# Patient Record
Sex: Male | Born: 2000 | Race: White | Hispanic: No | Marital: Single | Smoking: Never smoker
Health system: Southern US, Community
[De-identification: ages and names within clinical notes are randomized; demographics above are authoritative.]

## PROBLEM LIST (undated history)

## (undated) DIAGNOSIS — J45909 Unspecified asthma, uncomplicated: Secondary | ICD-10-CM

## (undated) HISTORY — PX: ADENOIDECTOMY: SUR15

## (undated) HISTORY — PX: TYMPANOSTOMY TUBE PLACEMENT: SHX32

## (undated) HISTORY — PX: TONSILLECTOMY: SUR1361

---
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
## 2022-05-19 ENCOUNTER — Ambulatory Visit (INDEPENDENT_AMBULATORY_CARE_PROVIDER_SITE_OTHER): Payer: 59 | Admitting: Family Medicine

## 2022-05-19 ENCOUNTER — Encounter: Payer: Self-pay | Admitting: Family Medicine

## 2022-05-19 VITALS — BP 122/80 | HR 75 | Temp 97.8°F | Resp 18 | Ht 72.0 in | Wt 161.0 lb

## 2022-05-19 DIAGNOSIS — Z114 Encounter for screening for human immunodeficiency virus [HIV]: Secondary | ICD-10-CM | POA: Diagnosis not present

## 2022-05-19 DIAGNOSIS — Z1159 Encounter for screening for other viral diseases: Secondary | ICD-10-CM

## 2022-05-19 DIAGNOSIS — Z0000 Encounter for general adult medical examination without abnormal findings: Secondary | ICD-10-CM

## 2022-05-19 LAB — CBC
HCT: 44.5 % (ref 36.0–49.0)
Hemoglobin: 15 g/dL (ref 12.0–16.0)
MCHC: 33.7 g/dL (ref 31.0–37.0)
MCV: 83.9 fl (ref 78.0–98.0)
Platelets: 281 10*3/uL (ref 150.0–575.0)
RBC: 5.3 Mil/uL (ref 3.80–5.70)
RDW: 13.5 % (ref 11.4–15.5)
WBC: 10.6 10*3/uL (ref 4.5–13.5)

## 2022-05-19 LAB — COMPREHENSIVE METABOLIC PANEL
ALT: 26 U/L (ref 0–53)
AST: 17 U/L (ref 0–37)
Albumin: 4.8 g/dL (ref 3.5–5.2)
Alkaline Phosphatase: 87 U/L (ref 52–171)
BUN: 16 mg/dL (ref 6–23)
CO2: 29 mEq/L (ref 19–32)
Calcium: 10.1 mg/dL (ref 8.4–10.5)
Chloride: 101 mEq/L (ref 96–112)
Creatinine, Ser: 0.74 mg/dL (ref 0.40–1.50)
GFR: 131.73 mL/min (ref >60.00)
Glucose, Bld: 82 mg/dL (ref 70–99)
Potassium: 4.5 mEq/L (ref 3.5–5.1)
Sodium: 139 mEq/L (ref 135–145)
Total Bilirubin: 0.5 mg/dL (ref 0.2–1.2)
Total Protein: 7.6 g/dL (ref 6.0–8.3)

## 2022-05-19 LAB — LIPID PANEL
Cholesterol: 194 mg/dL (ref 0–200)
HDL: 71.5 mg/dL (ref >39.00)
LDL Cholesterol: 111 mg/dL — ABNORMAL HIGH (ref 0–99)
NonHDL: 122.95
Total CHOL/HDL Ratio: 3
Triglycerides: 59 mg/dL (ref 0.0–149.0)
VLDL: 11.8 mg/dL (ref 0.0–40.0)

## 2022-05-19 NOTE — Progress Notes (Signed)
Chief Complaint  Patient presents with   Annual Exam    Concerns/ questions: none    Well Male Johnny Hampton is here for a complete physical.   His last physical was >1 year ago.  Current diet: in general, a "healthy" diet.   Current exercise: baseball workouts, lifting wts Weight trend: stable Fatigue out of ordinary? No. Seat belt? Yes.    Health maintenance Tetanus- Yes HIV- No Hep C- No  Past Medical History:  Diagnosis Date   Asthma    starting to grow out of it     Past Surgical History:  Procedure Laterality Date   ADENOIDECTOMY     TONSILLECTOMY     TYMPANOSTOMY TUBE PLACEMENT      Medications  Current Outpatient Medications on File Prior to Visit  Medication Sig Dispense Refill   ondansetron (ZOFRAN ODT) 4 MG disintegrating tablet Take 1 tablet (4 mg total) by mouth every 8 (eight) hours as needed for nausea or vomiting. 30 tablet 3   albuterol (PROVENTIL HFA;VENTOLIN HFA) 108 (90 BASE) MCG/ACT inhaler Inhale 2 puffs into the lungs every 6 (six) hours as needed for wheezing or shortness of breath. 1 Inhaler 0    Allergies Allergies  Allergen Reactions   Cephalosporins Rash   Omnicef [Cefdinir] Rash   Tape Rash    Family History Family History  Problem Relation Age of Onset   Cancer Mother        Thyroid   Hearing loss Father    Cancer Maternal Grandfather    COPD Paternal Grandfather    Diabetes Paternal Grandfather    Heart disease Paternal Grandfather    Kidney cancer Paternal Grandfather     Review of Systems: Constitutional: no fevers or chills Eye:  no recent significant change in vision Ear/Nose/Mouth/Throat:  Ears:  no hearing loss Nose/Mouth/Throat:  no complaints of nasal congestion, no sore throat Cardiovascular:  no chest pain Respiratory:  no shortness of breath Gastrointestinal:  no abdominal pain, no change in bowel habits GU:  Male: negative for dysuria Musculoskeletal/Extremities:  no pain of the joints Integumentary  (Skin/Breast):  no abnormal skin lesions reported Neurologic:  no headaches Endocrine: No unexpected weight changes Hematologic/Lymphatic:  no night sweats  Exam BP 122/80 (BP Location: Left Arm, Patient Position: Sitting, Cuff Size: Normal)   Pulse 75   Temp 97.8 F (36.6 C) (Temporal)   Resp 18   Ht 6' (1.829 m)   Wt 161 lb (73 kg)   SpO2 98%   BMI 21.84 kg/m  General:  well developed, well nourished, in no apparent distress Skin:  no significant moles, warts, or growths Head:  no masses, lesions, or tenderness Eyes:  pupils equal and round, sclera anicteric without injection Ears:  canals without lesions, TMs shiny without retraction, no obvious effusion, no erythema Nose:  nares patent, mucosa normal Throat/Pharynx:  lips and gingiva without lesion; tongue and uvula midline; non-inflamed pharynx; no exudates or postnasal drainage Neck: neck supple without adenopathy, thyromegaly, or masses Lungs:  clear to auscultation, breath sounds equal bilaterally, no respiratory distress Cardio:  regular rate and rhythm, no bruits, no LE edema Abdomen:  abdomen soft, nontender; bowel sounds normal; no masses or organomegaly Genital (male): Deferred Rectal: Deferred Musculoskeletal:  symmetrical muscle groups noted without atrophy or deformity Extremities:  no clubbing, cyanosis, or edema, no deformities, no skin discoloration Neuro:  gait normal; deep tendon reflexes normal and symmetric Psych: well oriented with normal range of affect and appropriate judgment/insight  Assessment  and Plan  Well adult exam - Plan: CBC, Comprehensive metabolic panel, Lipid panel  Screening for HIV without presence of risk factors - Plan: HIV Antibody (routine testing w rflx)  Encounter for hepatitis C screening test for low risk patient - Plan: Hepatitis C antibody   Well 21 y.o. male. Counseled on diet and exercise. Self testicular exams recommended at least monthly.  Briefly discussed HPV  vaccination which he politely declined.  Other orders as above. Follow up in 1 year pending the above workup. The patient voiced understanding and agreement to the plan.  Jilda Roche [place], DO 05/19/22 1:04 PM

## 2022-05-19 NOTE — Patient Instructions (Signed)
Give us 2-3 business days to get the results of your labs back.   Keep the diet clean and stay active.  Do monthly self testicular checks in the shower. You are feeling for lumps/bumps that don't belong. If you feel anything like this, let me know!  Let us know if you need anything. 

## 2022-05-20 LAB — HEPATITIS C ANTIBODY: Hepatitis C Ab: NONREACTIVE

## 2022-05-20 LAB — HIV ANTIBODY (ROUTINE TESTING W REFLEX): HIV 1&2 Ab, 4th Generation: NONREACTIVE

## 2022-05-26 ENCOUNTER — Encounter: Payer: Self-pay | Admitting: Family Medicine

## 2022-05-26 ENCOUNTER — Ambulatory Visit: Payer: 59 | Admitting: Family Medicine

## 2022-05-26 VITALS — BP 139/71 | HR 87 | Temp 99.5°F | Resp 16 | Wt 163.0 lb

## 2022-05-26 DIAGNOSIS — J101 Influenza due to other identified influenza virus with other respiratory manifestations: Secondary | ICD-10-CM | POA: Diagnosis not present

## 2022-05-26 LAB — POCT INFLUENZA A/B
Influenza A, POC: NEGATIVE
Influenza B, POC: POSITIVE — AB

## 2022-05-26 MED ORDER — OSELTAMIVIR PHOSPHATE 75 MG PO CAPS: 75.0000 mg | ORAL_CAPSULE | Freq: Two times a day (BID) | ORAL | 0 refills | Status: AC

## 2022-05-26 NOTE — Patient Instructions (Addendum)
OK to take Tylenol 1000 mg (2 extra strength tabs) or 975 mg (3 regular strength tabs) every 6 hours as needed.  Ibuprofen 400-600 mg (2-3 over the counter strength tabs) every 6 hours as needed for pain.  Continue to push fluids, practice good hand hygiene, and cover your mouth if you cough.  If you start having fevers, shaking or shortness of breath, seek immediate care.  Stay hydrated.   Have grandma send me a MyChart message.  Let us know if you need anything.

## 2022-05-26 NOTE — Progress Notes (Signed)
Chief Complaint  Patient presents with   Cough    Complains of coughing since yesterday, had negative covid test at home yesterday   Fever    Complains of fever of up to 103 on and off since yesterday    Garrie Woodin here for URI complaints. Here w mom.   Duration: 1 day  Associated symptoms: Fever (103 F) and cough Denies: sinus congestion, sinus pain, rhinorrhea, itchy watery eyes, ear pain, ear drainage, sore throat, wheezing, shortness of breath, and myalgia Treatment to date: Tylenol, ibuprofen Sick contacts: No  Past Medical History:  Diagnosis Date   Asthma    starting to grow out of it    Objective BP 139/71 (BP Location: Left Arm, Patient Position: Sitting, Cuff Size: Small)   Pulse 87   Temp 99.5 F (37.5 C) (Oral)   Resp 16   Wt 163 lb (73.9 kg)   SpO2 100%   BMI 22.11 kg/m  General: Awake, alert, appears stated age HEENT: AT, [place], ears patent b/l and TM's neg, nares patent w/o discharge, pharynx pink and without exudates, MMM Neck: No masses or asymmetry Heart: RRR Lungs: CTAB, no accessory muscle use Psych: Age appropriate judgment and insight, normal mood and affect  Influenza B - Plan: POCT Influenza A/B, oseltamivir (TAMIFLU) 75 MG capsule  Discussed starting Tamiflu. Grandma going thru breast cancer, she is our patient so will have her reach out to [place] for prophyContinue to push fluids, practice good hand hygiene, cover mouth when coughing. F/u prn. If starting to experience worsening symptoms, shaking, or shortness of breath, seek immediate care. Pt and his mom  voiced understanding and agreement to the plan.  Jilda Roche [place], DO 05/26/22 11:50 AM

## 2022-06-08 ENCOUNTER — Telehealth (INDEPENDENT_AMBULATORY_CARE_PROVIDER_SITE_OTHER): Payer: 59 | Admitting: Family Medicine

## 2022-06-08 ENCOUNTER — Encounter: Payer: Self-pay | Admitting: Family Medicine

## 2022-06-08 DIAGNOSIS — H1032 Unspecified acute conjunctivitis, left eye: Secondary | ICD-10-CM | POA: Diagnosis not present

## 2022-06-08 MED ORDER — POLYMYXIN B-TRIMETHOPRIM 10000-0.1 UNIT/ML-% OP SOLN: 1.0000 [drp] | OPHTHALMIC | 0 refills | Status: DC

## 2022-06-08 NOTE — Progress Notes (Signed)
No chief complaint on file.   Johnny Hampton is here for left eye irritation. Due to COVID-19 pandemic, we are interacting via web portal for an electronic face-to-face visit. I verified patient's ID using 2 identifiers. Patient agreed to proceed with visit via this method. Patient is at home, I am at office. Patient and I are present for visit.   Duration: 1  d Chemical exposure? No  Recent URI? No  Contact lenses? No  History of allergies? No  Treatment to date: Clear Eyes drops  Past Medical History:  Diagnosis Date   Asthma    starting to grow out of it   Family History  Problem Relation Age of Onset   Cancer Mother        Thyroid   Hearing loss Father    Cancer Maternal Grandfather    COPD Paternal Grandfather    Diabetes Paternal Grandfather    Heart disease Paternal Grandfather    Kidney cancer Paternal Grandfather    No conversational dyspnea Age appropriate judgment and insight Nml affect and mood  Acute conjunctivitis of left eye, unspecified acute conjunctivitis type - Plan: trimethoprim-polymyxin b (POLYTRIM) ophthalmic solution  Instructed to practice good hand hygiene and try not to touch face. Warm compresses and artificial tears also recommended. F/u if no improvement in 7-10 days. Pt voiced understanding and agreement to the plan.  [place], DO 06/08/22 4:24 PM

## 2023-01-12 ENCOUNTER — Ambulatory Visit: Payer: 59 | Admitting: Family Medicine

## 2023-05-24 ENCOUNTER — Ambulatory Visit (INDEPENDENT_AMBULATORY_CARE_PROVIDER_SITE_OTHER): Payer: 59 | Admitting: Family Medicine

## 2023-05-24 ENCOUNTER — Encounter: Payer: Self-pay | Admitting: Family Medicine

## 2023-05-24 VITALS — BP 140/90 | HR 97 | Temp 97.9°F | Resp 16 | Ht 72.0 in | Wt 154.0 lb

## 2023-05-24 DIAGNOSIS — Z0000 Encounter for general adult medical examination without abnormal findings: Secondary | ICD-10-CM

## 2023-05-24 DIAGNOSIS — F321 Major depressive disorder, single episode, moderate: Secondary | ICD-10-CM

## 2023-05-24 DIAGNOSIS — Z0001 Encounter for general adult medical examination with abnormal findings: Secondary | ICD-10-CM | POA: Diagnosis not present

## 2023-05-24 LAB — LIPID PANEL
Cholesterol: 158 mg/dL (ref 0–200)
HDL: 54.5 mg/dL (ref >39.00)
LDL Cholesterol: 93 mg/dL (ref 0–99)
NonHDL: 103.71
Total CHOL/HDL Ratio: 3
Triglycerides: 53 mg/dL (ref 0.0–149.0)
VLDL: 10.6 mg/dL (ref 0.0–40.0)

## 2023-05-24 LAB — CBC
HCT: 44.5 % (ref 39.0–52.0)
Hemoglobin: 15 g/dL (ref 13.0–17.0)
MCHC: 33.6 g/dL (ref 30.0–36.0)
MCV: 84.4 fL (ref 78.0–100.0)
Platelets: 338 10*3/uL (ref 150.0–400.0)
RBC: 5.27 Mil/uL (ref 4.22–5.81)
RDW: 13.4 % (ref 11.5–14.6)
WBC: 9.3 10*3/uL (ref 4.5–10.5)

## 2023-05-24 LAB — COMPREHENSIVE METABOLIC PANEL
ALT: 21 U/L (ref 0–53)
AST: 22 U/L (ref 0–37)
Albumin: 4.9 g/dL (ref 3.5–5.2)
Alkaline Phosphatase: 96 U/L (ref 39–117)
BUN: 12 mg/dL (ref 6–23)
CO2: 28 meq/L (ref 19–32)
Calcium: 10.1 mg/dL (ref 8.4–10.5)
Chloride: 103 meq/L (ref 96–112)
Creatinine, Ser: 0.83 mg/dL (ref 0.40–1.50)
GFR: 126.34 mL/min (ref >60.00)
Glucose, Bld: 95 mg/dL (ref 70–99)
Potassium: 4.1 meq/L (ref 3.5–5.1)
Sodium: 143 meq/L (ref 135–145)
Total Bilirubin: 0.9 mg/dL (ref 0.2–1.2)
Total Protein: 7.7 g/dL (ref 6.0–8.3)

## 2023-05-24 MED ORDER — SERTRALINE HCL 25 MG PO TABS: 25.0000 mg | ORAL_TABLET | Freq: Every day | ORAL | 3 refills | Status: DC

## 2023-05-24 NOTE — Progress Notes (Signed)
Chief Complaint  Patient presents with   Annual Exam   Depression    Well Male Johnny Hampton is here for a complete physical.   His last physical was >1 year ago.  Current diet: in general, a "healthy" diet.   Current exercise: strength training, cardio, baseball drills Weight trend: stable Fatigue out of ordinary? No. Seat belt? Mostly.  Health maintenance Tetanus- Due HIV- Yes Hep C- Yes  A little over a year ago, the patient started having loss of interest in doing things.  He denies any anxiety.  + Family history of anxiety depression.  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  No thoughts of harming himself or others, no self-medication.  He is following with a therapist but does not tell her everything he is going through.  Past Medical History:  Diagnosis Date   Asthma    starting to grow out of it     Past Surgical History:  Procedure Laterality Date   ADENOIDECTOMY     TONSILLECTOMY     TYMPANOSTOMY TUBE PLACEMENT      Medications  Takes no meds routinely.   Allergies Allergies  Allergen Reactions   Cephalosporins Rash   Omnicef [Cefdinir] Rash   Tape Rash    Family History Family History  Problem Relation Age of Onset   Cancer Mother        Thyroid   Hearing loss Father    Cancer Maternal Grandfather    COPD Paternal Grandfather    Diabetes Paternal Grandfather    Heart disease Paternal Grandfather    Kidney cancer Paternal Grandfather     Review of Systems: Constitutional: no fevers or chills Eye:  no recent significant change in vision Ear/Nose/Mouth/Throat:  Ears:  no hearing loss Nose/Mouth/Throat:  no complaints of nasal congestion, no sore throat Cardiovascular:  no chest pain Respiratory:  no shortness of breath Gastrointestinal:  no abdominal pain, no change in bowel habits GU:  Male: negative for  dysuria Musculoskeletal/Extremities:  no pain of the joints Integumentary (Skin/Breast):  no abnormal skin lesions reported Neurologic:  no headaches Endocrine: No unexpected weight changes Hematologic/Lymphatic:  no night sweats  Exam BP (!) 140/90 (BP Location: Left Arm, Patient Position: Sitting, Cuff Size: Small)   Pulse 97   Temp 97.9 F (36.6 C) (Oral)   Resp 16   Ht 6' (1.829 m)   Wt 154 lb (69.9 kg)   SpO2 97%   BMI 20.89 kg/m  General:  well developed, well nourished, in no apparent distress Skin:  no significant moles, warts, or growths Head:  no masses, lesions, or tenderness Eyes:  pupils equal and round, sclera anicteric without injection Ears:  canals without lesions, TMs shiny without retraction, no obvious effusion, no erythema Nose:  nares patent, mucosa normal Throat/Pharynx:  lips and gingiva without lesion; tongue and uvula midline; non-inflamed pharynx; no exudates or postnasal drainage Neck: neck supple without adenopathy, thyromegaly, or masses Lungs:  clear to auscultation, breath sounds equal bilaterally, no respiratory distress Cardio:  regular rate and rhythm, no bruits, no LE edema Abdomen:  abdomen soft, nontender; bowel sounds normal; no masses or organomegaly Genital (male): Deferred Rectal: Deferred Musculoskeletal:  symmetrical muscle groups noted without atrophy or deformity Extremities:  no clubbing, cyanosis, or edema, no deformities, no skin discoloration Neuro:  gait normal; deep tendon reflexes normal and symmetric Psych: well oriented with normal range of affect and appropriate  judgment/insight  Assessment and Plan  Well adult exam - Plan: CBC, Comprehensive metabolic panel, Lipid panel  Depression, major, single episode, moderate (HCC) - Plan: sertraline (ZOLOFT) 25 MG tablet   Well 22 y.o. male. Counseled on diet and exercise. Self testicular exams recommended at least monthly.  Depression: Chronic, not controlled.  Recommended he  find a new counselor who he is more comfortable communicating with.  A list of resources were provided in his AVS.  Start Zoloft 25 mg daily.  I will plan to see him in around 6 weeks to follow-up on this. We forgot to update his tetanus shot before he left. Other orders as above. The patient voiced understanding and agreement to the plan.  Jilda Roche [place], DO 05/24/23 11:41 AM

## 2023-05-24 NOTE — Patient Instructions (Addendum)
Give [place] 2-3 business days to get the results of your labs back.   Keep the diet clean and stay active.  Do monthly self testicular checks in the shower. You are feeling for lumps/bumps that don't belong. If you feel anything like this, let me know!  Please consider counseling. Contact [phone] to schedule an appointment or inquire about cost/insurance coverage.  [facility] located at [address], [place], [place].  Phone number = [phone].  Dr. Regan Lemming - Adult Psychiatry.    [facility] located at [address] [place], [place], [place]. Phone number = [phone].   [facility] located at [address] [place], [place].  Phone number = [phone].   [facility] located at [address] [place], [place], [place].  Phone number = [phone].  Let us know if you need anything.

## 2023-07-14 ENCOUNTER — Encounter: Payer: Self-pay | Admitting: Family Medicine

## 2023-07-14 ENCOUNTER — Telehealth: Payer: 59 | Admitting: Family Medicine

## 2023-07-14 DIAGNOSIS — F321 Major depressive disorder, single episode, moderate: Secondary | ICD-10-CM | POA: Diagnosis not present

## 2023-07-14 MED ORDER — SERTRALINE HCL 50 MG PO TABS: 50.0000 mg | ORAL_TABLET | Freq: Every day | ORAL | 3 refills | Status: DC

## 2023-07-14 NOTE — Progress Notes (Signed)
Chief Complaint  Patient presents with   Medication Refill    Discuss Medication     Subjective Johnny Hampton presents for f/u depression. We are interacting via web portal for an electronic face-to-face visit. I verified patient's ID using 2 identifiers. Patient agreed to proceed with visit via this method. Patient is in his dorm room, I am at office. Patient and I are present for visit.   Pt is currently being treated with Zoloft 25 mg/d.  Reports doing 75% better since treatment. No thoughts of harming self or others. No self-medication with alcohol, prescription drugs or illicit drugs. Pt is following with a counselor/psychologist.  Past Medical History:  Diagnosis Date   Asthma    starting to grow out of it   Allergies as of 07/14/2023       Reactions   Cephalosporins Rash   Omnicef [cefdinir] Rash   Tape Rash        Medication List        Accurate as of July 14, 2023  1:00 PM. If you have any questions, ask your nurse or doctor.          sertraline 50 MG tablet Commonly known as: ZOLOFT Take 1 tablet (50 mg total) by mouth daily. What changed:  medication strength how much to take Changed by: Sharlene Dory        Exam No conversational dyspnea Age appropriate judgment and insight Nml affect and mood  Assessment and Plan  Depression, major, single episode, moderate (HCC) - Plan: sertraline (ZOLOFT) 50 MG tablet  Chronic, not controlled. Increase Zoloft from 25 mg/d to 50 mg/d. Cont w counseling team.  F/u in 6 weeks. The patient voiced understanding and agreement to the plan.  Johnny Roche [place], DO 07/14/23 1:00 PM

## 2023-08-11 ENCOUNTER — Telehealth: Payer: 59 | Admitting: Family Medicine

## 2023-08-11 ENCOUNTER — Encounter: Payer: Self-pay | Admitting: Family Medicine

## 2023-08-11 DIAGNOSIS — F325 Major depressive disorder, single episode, in full remission: Secondary | ICD-10-CM | POA: Diagnosis not present

## 2023-08-11 MED ORDER — SERTRALINE HCL 50 MG PO TABS: 50.0000 mg | ORAL_TABLET | Freq: Every day | ORAL | 1 refills | Status: DC

## 2023-08-11 NOTE — Progress Notes (Signed)
 CC: Follow up  Subjective Johnny Hampton presents for f/u depression. We are interacting via web portal for an electronic face-to-face visit. I verified patient's ID using 2 identifiers. Patient agreed to proceed with visit via this method. Patient is in his dorm room, I am at office. Patient and I are present for visit.   Pt is currently being treated with Zoloft 50 mg daily.  Reports doing very well since treatment. No thoughts of harming self or others. No self-medication with alcohol, prescription drugs or illicit drugs. Pt is following with a counselor/psychologist.  Past Medical History:  Diagnosis Date   Asthma    starting to grow out of it   Allergies as of 08/11/2023       Reactions   Cephalosporins Rash   Omnicef [cefdinir] Rash   Tape Rash        Medication List        Accurate as of August 11, 2023  1:00 PM. If you have any questions, ask your nurse or doctor.          sertraline 50 MG tablet Commonly known as: ZOLOFT Take 1 tablet (50 mg total) by mouth daily.        Exam No conversational dyspnea Age appropriate judgment and insight Nml affect and mood  Assessment and Plan  Depression, major, single episode, complete remission (HCC) - Plan: sertraline (ZOLOFT) 50 MG tablet  Chronic, stable.  Continue Zoloft 50 mg daily.  Continue following with the counseling team.  Counseled on exercise. F/u in 5 to 6 months. The patient voiced understanding and agreement to the plan.  Jilda Roche [place], DO 08/11/23 1:00 PM

## 2024-01-11 ENCOUNTER — Encounter: Payer: Self-pay | Admitting: Family Medicine

## 2024-01-11 ENCOUNTER — Ambulatory Visit: Admitting: Family Medicine

## 2024-01-11 VITALS — BP 118/78 | HR 86 | Temp 98.0°F | Resp 16 | Ht 73.0 in | Wt 163.6 lb

## 2024-01-11 DIAGNOSIS — F325 Major depressive disorder, single episode, in full remission: Secondary | ICD-10-CM | POA: Diagnosis not present

## 2024-01-11 MED ORDER — SERTRALINE HCL 50 MG PO TABS: 50.0000 mg | ORAL_TABLET | Freq: Every day | ORAL | 1 refills | Status: DC

## 2024-01-11 NOTE — Progress Notes (Signed)
 Chief Complaint  Patient presents with   Follow-up    Follow up    Subjective Johnny Hampton presents for f/u depression.  Pt is currently being treated with Zoloft  50 mg/d.  Reports doing well since treatment. No thoughts of harming self or others. No self-medication with alcohol, prescription drugs or illicit drugs. Pt is following with a counselor/psychologist.  Past Medical History:  Diagnosis Date   Asthma    starting to grow out of it   Allergies as of 01/11/2024       Reactions   Cephalosporins Rash   Omnicef [cefdinir] Rash   Tape Rash        Medication List        Accurate as of January 11, 2024 11:16 AM. If you have any questions, ask your nurse or doctor.          sertraline  50 MG tablet Commonly known as: ZOLOFT  Take 1 tablet (50 mg total) by mouth daily.        Exam BP 118/78 (BP Location: Left Arm, Patient Position: Sitting)   Pulse 86   Temp 98 F (36.7 C) (Oral)   Resp 16   Ht 6' 1 (1.854 m)   Wt 163 lb 9.6 oz (74.2 kg)   SpO2 99%   BMI 21.58 kg/m  General:  well developed, well nourished, in no apparent distress Lungs:  No respiratory distress Psych: well oriented with normal range of affect and age-appropriate judgement/insight, alert and oriented x4.  Assessment and Plan  Depression, major, single episode, complete remission (HCC) - Plan: sertraline  (ZOLOFT ) 50 MG tablet  Chronic, stable. Cont Zoloft  50 mg/d, counseling.  F/u in 6 mo. The patient voiced understanding and agreement to the plan.  Mabel Mt [place], DO 01/11/24 11:16 AM

## 2024-01-11 NOTE — Patient Instructions (Signed)
 Stay active.   Let me know if you need refills.   Let us  know if you need anything.

## 2024-02-18 ENCOUNTER — Other Ambulatory Visit: Payer: Self-pay | Admitting: Family Medicine

## 2024-02-18 DIAGNOSIS — F325 Major depressive disorder, single episode, in full remission: Secondary | ICD-10-CM

## 2024-05-16 ENCOUNTER — Encounter: Payer: Self-pay | Admitting: Family Medicine

## 2024-05-16 ENCOUNTER — Ambulatory Visit: Admitting: Family Medicine

## 2024-05-16 VITALS — BP 126/76 | HR 88 | Temp 98.0°F | Resp 16 | Ht 73.0 in | Wt 173.0 lb

## 2024-05-16 DIAGNOSIS — F411 Generalized anxiety disorder: Secondary | ICD-10-CM | POA: Insufficient documentation

## 2024-05-16 DIAGNOSIS — F339 Major depressive disorder, recurrent, unspecified: Secondary | ICD-10-CM | POA: Insufficient documentation

## 2024-05-16 MED ORDER — SERTRALINE HCL 100 MG PO TABS: 100.0000 mg | ORAL_TABLET | Freq: Every day | ORAL | 3 refills | Status: AC

## 2024-05-16 NOTE — Progress Notes (Signed)
 Chief Complaint  Patient presents with   Depression    Depression     Subjective Johnny Hampton presents for f/u anxiety/depression.  Here with mom who helps with the history.  Pt is currently being treated with Zoloft  50 mg/d.  Reports it is not as helpful as it used to be over the past semester. Has been more irritable and losing concentration. Anxious and depressed mood also.  No thoughts of harming self or others. No self-medication with alcohol, prescription drugs or illicit drugs. Pt is not currently following with a counselor/psychologist.  Past Medical History:  Diagnosis Date   Asthma    starting to grow out of it   Allergies as of 05/16/2024       Reactions   Cephalosporins Rash   Omnicef [cefdinir] Rash   Tape Rash        Medication List        Accurate as of May 16, 2024 11:37 AM. If you have any questions, ask your nurse or doctor.          sertraline  100 MG tablet Commonly known as: ZOLOFT  Take 1 tablet (100 mg total) by mouth daily. What changed:  medication strength how much to take Changed by: Mabel [place], DO        Exam BP 126/76 (BP Location: Left Arm, Patient Position: Sitting)   Pulse 88   Temp 98 F (36.7 C) (Oral)   Resp 16   Ht 6' 1 (1.854 m)   Wt 173 lb (78.5 kg)   SpO2 99%   BMI 22.82 kg/m  General:  well developed, well nourished, in no apparent distress Lungs:  No respiratory distress Psych: well oriented with normal range of affect and age-appropriate judgement/insight, alert and oriented x4.  Assessment and Plan  Depression, recurrent  GAD (generalized anxiety disorder)  Chronic, not controlled.  Increase Zoloft  from 50 mg daily to 100 mg daily.  Consider reaching out to therapy again.  Counseled on exercise.  Follow-up in 6 weeks to recheck. The patient and his mom voiced understanding and agreement to the plan.  Mabel Mt [place], DO 05/16/2024 11:37 AM

## 2024-05-16 NOTE — Patient Instructions (Signed)
 Keep the diet clean and stay active.  Let me know if there are issues along with way.   I expect your attention to improve with this.   Let us  know if you need anything.

## 2024-06-27 ENCOUNTER — Telehealth: Admitting: Family Medicine

## 2024-06-27 ENCOUNTER — Encounter: Payer: Self-pay | Admitting: Family Medicine

## 2024-06-27 DIAGNOSIS — F339 Major depressive disorder, recurrent, unspecified: Secondary | ICD-10-CM | POA: Diagnosis not present

## 2024-06-27 DIAGNOSIS — F411 Generalized anxiety disorder: Secondary | ICD-10-CM

## 2024-06-27 NOTE — Progress Notes (Signed)
 Chief Complaint  Patient presents with   Follow-up    Follow Up    Subjective Johnny Hampton presents for f/u anxiety/depression. We are interacting via web portal for an electronic face-to-face visit. I verified patient's ID using 2 identifiers. Patient agreed to proceed with visit via this method. Patient is at home, I am at office. Patient and I are present for visit.   Pt is currently being treated with Zoloft  100 mg/d.  Reports doing much better since treatment. Compliant, no AE's.  No thoughts of harming self or others. No self-medication with alcohol, prescription drugs or illicit drugs. Pt is not following with a counselor/psychologist.  Past Medical History:  Diagnosis Date   Asthma    starting to grow out of it   Allergies as of 06/27/2024       Reactions   Cephalosporins Rash   Omnicef [cefdinir] Rash   Tape Rash        Medication List        Accurate as of June 27, 2024  1:18 PM. If you have any questions, ask your nurse or doctor.          sertraline  100 MG tablet Commonly known as: ZOLOFT  Take 1 tablet (100 mg total) by mouth daily.        Exam No conversational dyspnea Age appropriate judgment and insight Nml affect and mood  Assessment and Plan  GAD (generalized anxiety disorder)  Depression, recurrent  Chronic, stable. Cont Zoloft  100 mg/d. Consider counseling.  F/u in 6 mo. The patient voiced understanding and agreement to the plan.  Mabel Mt [place], DO 06/27/24 1:18 PM
